# Patient Record
Sex: Male | Born: 1990 | Race: White | Hispanic: No | Marital: Single | Smoking: Never smoker
Health system: Southern US, Community
[De-identification: ages and names within clinical notes are randomized; demographics above are authoritative.]

## PROBLEM LIST (undated history)

## (undated) DIAGNOSIS — S060XAA Concussion with loss of consciousness status unknown, initial encounter: Secondary | ICD-10-CM

## (undated) DIAGNOSIS — F32A Depression, unspecified: Secondary | ICD-10-CM

## (undated) DIAGNOSIS — S82899A Other fracture of unspecified lower leg, initial encounter for closed fracture: Secondary | ICD-10-CM

## (undated) DIAGNOSIS — F329 Major depressive disorder, single episode, unspecified: Secondary | ICD-10-CM

## (undated) DIAGNOSIS — S060X9A Concussion with loss of consciousness of unspecified duration, initial encounter: Secondary | ICD-10-CM

## (undated) DIAGNOSIS — S2239XA Fracture of one rib, unspecified side, initial encounter for closed fracture: Secondary | ICD-10-CM

## (undated) DIAGNOSIS — T1491XA Suicide attempt, initial encounter: Secondary | ICD-10-CM

## (undated) HISTORY — PX: TONSILLECTOMY: SUR1361

---

## 2010-01-05 ENCOUNTER — Other Ambulatory Visit: Payer: Self-pay | Admitting: Emergency Medicine

## 2010-01-05 ENCOUNTER — Inpatient Hospital Stay (HOSPITAL_COMMUNITY): Admission: RE | Admit: 2010-01-05 | Discharge: 2010-01-09 | Payer: Self-pay | Admitting: Psychiatry

## 2010-01-05 ENCOUNTER — Ambulatory Visit: Payer: Self-pay | Admitting: Psychiatry

## 2010-01-15 ENCOUNTER — Ambulatory Visit: Payer: Self-pay | Admitting: Psychiatry

## 2010-01-20 ENCOUNTER — Ambulatory Visit: Payer: Self-pay | Admitting: Psychiatry

## 2010-01-30 ENCOUNTER — Ambulatory Visit: Payer: Self-pay | Admitting: Psychiatry

## 2010-02-06 ENCOUNTER — Ambulatory Visit: Payer: Self-pay | Admitting: Psychiatry

## 2010-03-04 ENCOUNTER — Ambulatory Visit: Payer: Self-pay | Admitting: Psychiatry

## 2010-03-31 ENCOUNTER — Ambulatory Visit: Payer: Self-pay | Admitting: Psychiatry

## 2010-05-05 ENCOUNTER — Ambulatory Visit: Payer: Self-pay | Admitting: Psychiatry

## 2011-01-12 LAB — URINALYSIS, ROUTINE W REFLEX MICROSCOPIC
Hgb urine dipstick: NEGATIVE
Nitrite: NEGATIVE
Protein, ur: NEGATIVE mg/dL
Specific Gravity, Urine: 1.031 — ABNORMAL HIGH (ref 1.005–1.030)
Urobilinogen, UA: 1 mg/dL (ref 0.0–1.0)

## 2011-01-12 LAB — COMPREHENSIVE METABOLIC PANEL
ALT: 30 U/L (ref 0–53)
AST: 28 U/L (ref 0–37)
CO2: 24 mEq/L (ref 19–32)
Chloride: 107 mEq/L (ref 96–112)
Creatinine, Ser: 0.77 mg/dL (ref 0.4–1.5)
GFR calc Af Amer: >60 mL/min (ref >60)
GFR calc non Af Amer: >60 mL/min (ref >60)
Glucose, Bld: 120 mg/dL — ABNORMAL HIGH (ref 70–99)
Sodium: 139 mEq/L (ref 135–145)
Total Bilirubin: 0.7 mg/dL (ref 0.3–1.2)

## 2011-01-12 LAB — CBC
Hemoglobin: 15 g/dL (ref 13.0–17.0)
MCV: 88.5 fL (ref 78.0–100.0)
RBC: 5.06 MIL/uL (ref 4.22–5.81)
WBC: 9.8 10*3/uL (ref 4.0–10.5)

## 2011-01-12 LAB — DIFFERENTIAL
Basophils Absolute: 0 10*3/uL (ref 0.0–0.1)
Basophils Relative: 0 % (ref 0–1)
Eosinophils Absolute: 0.2 10*3/uL (ref 0.0–0.7)
Eosinophils Relative: 2 % (ref 0–5)
Neutrophils Relative %: 56 % (ref 43–77)

## 2011-01-12 LAB — RAPID URINE DRUG SCREEN, HOSP PERFORMED
Amphetamines: NOT DETECTED
Barbiturates: NOT DETECTED
Opiates: NOT DETECTED

## 2011-01-12 LAB — SALICYLATE LEVEL: Salicylate Lvl: <4 mg/dL (ref 2.8–20.0)

## 2013-02-12 ENCOUNTER — Emergency Department (HOSPITAL_COMMUNITY): Payer: Federal, State, Local not specified - PPO

## 2013-02-12 ENCOUNTER — Inpatient Hospital Stay (HOSPITAL_COMMUNITY)
Admission: EM | Admit: 2013-02-12 | Discharge: 2013-02-15 | DRG: 533 | Disposition: A | Discharge status: Home / Self Care | Payer: Federal, State, Local not specified - PPO | Attending: General Surgery | Admitting: General Surgery

## 2013-02-12 DIAGNOSIS — S27322A Contusion of lung, bilateral, initial encounter: Secondary | ICD-10-CM

## 2013-02-12 DIAGNOSIS — S060X9A Concussion with loss of consciousness of unspecified duration, initial encounter: Secondary | ICD-10-CM

## 2013-02-12 DIAGNOSIS — Z6833 Body mass index (BMI) 33.0-33.9, adult: Secondary | ICD-10-CM

## 2013-02-12 DIAGNOSIS — M79609 Pain in unspecified limb: Secondary | ICD-10-CM | POA: Diagnosis present

## 2013-02-12 DIAGNOSIS — IMO0002 Reserved for concepts with insufficient information to code with codable children: Secondary | ICD-10-CM | POA: Diagnosis present

## 2013-02-12 DIAGNOSIS — Y92410 Unspecified street and highway as the place of occurrence of the external cause: Secondary | ICD-10-CM

## 2013-02-12 DIAGNOSIS — S92102A Unspecified fracture of left talus, initial encounter for closed fracture: Secondary | ICD-10-CM

## 2013-02-12 DIAGNOSIS — S27329A Contusion of lung, unspecified, initial encounter: Secondary | ICD-10-CM | POA: Diagnosis present

## 2013-02-12 DIAGNOSIS — S2242XA Multiple fractures of ribs, left side, initial encounter for closed fracture: Secondary | ICD-10-CM

## 2013-02-12 DIAGNOSIS — S82899A Other fracture of unspecified lower leg, initial encounter for closed fracture: Secondary | ICD-10-CM

## 2013-02-12 DIAGNOSIS — S92109A Unspecified fracture of unspecified talus, initial encounter for closed fracture: Secondary | ICD-10-CM | POA: Diagnosis present

## 2013-02-12 DIAGNOSIS — T07XXXA Unspecified multiple injuries, initial encounter: Secondary | ICD-10-CM | POA: Diagnosis present

## 2013-02-12 DIAGNOSIS — S2249XA Multiple fractures of ribs, unspecified side, initial encounter for closed fracture: Secondary | ICD-10-CM | POA: Diagnosis present

## 2013-02-12 DIAGNOSIS — S61409A Unspecified open wound of unspecified hand, initial encounter: Secondary | ICD-10-CM

## 2013-02-12 DIAGNOSIS — S060X0A Concussion without loss of consciousness, initial encounter: Principal | ICD-10-CM | POA: Diagnosis present

## 2013-02-12 DIAGNOSIS — S335XXA Sprain of ligaments of lumbar spine, initial encounter: Secondary | ICD-10-CM | POA: Diagnosis present

## 2013-02-12 DIAGNOSIS — M542 Cervicalgia: Secondary | ICD-10-CM | POA: Diagnosis present

## 2013-02-12 DIAGNOSIS — J9600 Acute respiratory failure, unspecified whether with hypoxia or hypercapnia: Secondary | ICD-10-CM | POA: Diagnosis present

## 2013-02-12 DIAGNOSIS — S060XAA Concussion with loss of consciousness status unknown, initial encounter: Secondary | ICD-10-CM

## 2013-02-12 DIAGNOSIS — J95821 Acute postprocedural respiratory failure: Secondary | ICD-10-CM

## 2013-02-12 DIAGNOSIS — S4350XA Sprain of unspecified acromioclavicular joint, initial encounter: Secondary | ICD-10-CM | POA: Diagnosis present

## 2013-02-12 DIAGNOSIS — E663 Overweight: Secondary | ICD-10-CM | POA: Diagnosis present

## 2013-02-12 DIAGNOSIS — M545 Low back pain, unspecified: Secondary | ICD-10-CM | POA: Diagnosis present

## 2013-02-12 DIAGNOSIS — S39012A Strain of muscle, fascia and tendon of lower back, initial encounter: Secondary | ICD-10-CM

## 2013-02-12 DIAGNOSIS — R4182 Altered mental status, unspecified: Secondary | ICD-10-CM | POA: Diagnosis present

## 2013-02-12 DIAGNOSIS — M25569 Pain in unspecified knee: Secondary | ICD-10-CM | POA: Diagnosis present

## 2013-02-12 HISTORY — DX: Depression, unspecified: F32.A

## 2013-02-12 HISTORY — DX: Other fracture of unspecified lower leg, initial encounter for closed fracture: S82.899A

## 2013-02-12 HISTORY — DX: Major depressive disorder, single episode, unspecified: F32.9

## 2013-02-12 LAB — POCT I-STAT, CHEM 8
Calcium, Ion: 1.1 mmol/L — ABNORMAL LOW (ref 1.12–1.23)
Creatinine, Ser: 1.2 mg/dL (ref 0.50–1.35)
Glucose, Bld: 132 mg/dL — ABNORMAL HIGH (ref 70–99)
HCT: 48 % (ref 39.0–52.0)
Hemoglobin: 16.3 g/dL (ref 13.0–17.0)
TCO2: 23 mmol/L (ref 0–100)

## 2013-02-12 LAB — COMPREHENSIVE METABOLIC PANEL
AST: 34 U/L (ref 0–37)
Albumin: 3.7 g/dL (ref 3.5–5.2)
BUN: 12 mg/dL (ref 6–23)
Calcium: 9.2 mg/dL (ref 8.4–10.5)
Creatinine, Ser: 0.87 mg/dL (ref 0.50–1.35)
Total Protein: 7 g/dL (ref 6.0–8.3)

## 2013-02-12 LAB — CBC WITH DIFFERENTIAL/PLATELET
Eosinophils Relative: 4 % (ref 0–5)
Hemoglobin: 16 g/dL (ref 13.0–17.0)
Lymphocytes Relative: 35 % (ref 12–46)
Lymphs Abs: 5.2 10*3/uL — ABNORMAL HIGH (ref 0.7–4.0)
MCV: 82.5 fL (ref 78.0–100.0)
Neutrophils Relative %: 56 % (ref 43–77)
Platelets: 307 10*3/uL (ref 150–400)
RBC: 5.44 MIL/uL (ref 4.22–5.81)
WBC: 14.9 10*3/uL — ABNORMAL HIGH (ref 4.0–10.5)

## 2013-02-12 LAB — POCT I-STAT 3, ART BLOOD GAS (G3+)
Acid-base deficit: 7 mmol/L — ABNORMAL HIGH (ref 0.0–2.0)
O2 Saturation: 100 %
Patient temperature: 98.6

## 2013-02-12 LAB — URINALYSIS, MICROSCOPIC ONLY
Glucose, UA: NEGATIVE mg/dL
Leukocytes, UA: NEGATIVE
Protein, ur: 100 mg/dL — AB
Specific Gravity, Urine: 1.015 (ref 1.005–1.030)
pH: 5 (ref 5.0–8.0)

## 2013-02-12 LAB — URINE MICROSCOPIC-ADD ON

## 2013-02-12 LAB — CBC
HCT: 42.7 % (ref 39.0–52.0)
MCH: 28.9 pg (ref 26.0–34.0)
MCV: 82.3 fL (ref 78.0–100.0)
Platelets: 238 10*3/uL (ref 150–400)
RBC: 5.19 MIL/uL (ref 4.22–5.81)
RDW: 12.9 % (ref 11.5–15.5)

## 2013-02-12 LAB — URINALYSIS, ROUTINE W REFLEX MICROSCOPIC
Leukocytes, UA: NEGATIVE
Nitrite: NEGATIVE
Specific Gravity, Urine: 1.041 — ABNORMAL HIGH (ref 1.005–1.030)
Urobilinogen, UA: 0.2 mg/dL (ref 0.0–1.0)

## 2013-02-12 LAB — RAPID URINE DRUG SCREEN, HOSP PERFORMED: Barbiturates: NOT DETECTED

## 2013-02-12 LAB — GLUCOSE, CAPILLARY
Glucose-Capillary: 123 mg/dL — ABNORMAL HIGH (ref 70–99)
Glucose-Capillary: 155 mg/dL — ABNORMAL HIGH (ref 70–99)

## 2013-02-12 LAB — PROTIME-INR: INR: 1.05 (ref 0.00–1.49)

## 2013-02-12 LAB — BASIC METABOLIC PANEL
BUN: 10 mg/dL (ref 6–23)
CO2: 19 mEq/L (ref 19–32)
Calcium: 8.7 mg/dL (ref 8.4–10.5)
Creatinine, Ser: 0.66 mg/dL (ref 0.50–1.35)

## 2013-02-12 MED ORDER — MIDAZOLAM HCL 2 MG/2ML IJ SOLN
INTRAMUSCULAR | Status: AC
  Administered 2013-02-12: 2 mg
  Filled 2013-02-12: qty 4

## 2013-02-12 MED ORDER — SODIUM CHLORIDE 0.9 % IV SOLN
1.0000 mg/h | INTRAVENOUS | Status: DC
  Filled 2013-02-12: qty 10

## 2013-02-12 MED ORDER — SUCCINYLCHOLINE CHLORIDE 20 MG/ML IJ SOLN
INTRAMUSCULAR | Status: AC
  Administered 2013-02-12: 20 mg
  Filled 2013-02-12: qty 1

## 2013-02-12 MED ORDER — TETANUS-DIPHTHERIA TOXOIDS TD 5-2 LFU IM INJ
0.5000 mL | INJECTION | Freq: Once | INTRAMUSCULAR | Status: AC
  Administered 2013-02-12: 0.5 mL via INTRAMUSCULAR
  Filled 2013-02-12: qty 0.5

## 2013-02-12 MED ORDER — MIDAZOLAM HCL 2 MG/2ML IJ SOLN
2.0000 mg | INTRAMUSCULAR | Status: DC | PRN
  Administered 2013-02-12 (×3): 2 mg via INTRAVENOUS

## 2013-02-12 MED ORDER — ONDANSETRON HCL 4 MG PO TABS: 4.0000 mg | ORAL_TABLET | Freq: Four times a day (QID) | ORAL | Status: DC | PRN

## 2013-02-12 MED ORDER — IOHEXOL 300 MG/ML  SOLN
100.0000 mL | Freq: Once | INTRAMUSCULAR | Status: AC | PRN
  Administered 2013-02-12: 100 mL via INTRAVENOUS

## 2013-02-12 MED ORDER — MIDAZOLAM HCL 2 MG/2ML IJ SOLN: 2.0000 mg | Freq: Once | INTRAMUSCULAR | Status: DC

## 2013-02-12 MED ORDER — ROCURONIUM BROMIDE 50 MG/5ML IV SOLN
INTRAVENOUS | Status: AC
  Administered 2013-02-12: 10 mg
  Filled 2013-02-12: qty 2

## 2013-02-12 MED ORDER — OXYCODONE HCL 5 MG PO TABS
5.0000 mg | ORAL_TABLET | ORAL | Status: DC | PRN
  Administered 2013-02-12: 10 mg via ORAL
  Administered 2013-02-12: 5 mg via ORAL
  Administered 2013-02-12 – 2013-02-15 (×11): 10 mg via ORAL
  Filled 2013-02-12 (×11): qty 2
  Filled 2013-02-12: qty 1
  Filled 2013-02-12: qty 2

## 2013-02-12 MED ORDER — VECURONIUM BROMIDE 10 MG IV SOLR: 10.0000 mg | Freq: Once | INTRAVENOUS | Status: DC

## 2013-02-12 MED ORDER — FENTANYL CITRATE 0.05 MG/ML IJ SOLN
INTRAMUSCULAR | Status: AC
  Filled 2013-02-12: qty 2

## 2013-02-12 MED ORDER — MIDAZOLAM HCL 2 MG/2ML IJ SOLN
4.0000 mg | Freq: Once | INTRAMUSCULAR | Status: AC
  Administered 2013-02-12: 4 mg via INTRAVENOUS

## 2013-02-12 MED ORDER — MIDAZOLAM HCL 2 MG/2ML IJ SOLN
2.0000 mg | Freq: Once | INTRAMUSCULAR | Status: DC
  Filled 2013-02-12: qty 2

## 2013-02-12 MED ORDER — LIDOCAINE HCL (CARDIAC) 20 MG/ML IV SOLN
INTRAVENOUS | Status: AC
  Filled 2013-02-12: qty 5

## 2013-02-12 MED ORDER — BACITRACIN-NEOMYCIN-POLYMYXIN OINTMENT TUBE
TOPICAL_OINTMENT | Freq: Every day | CUTANEOUS | Status: DC
  Administered 2013-02-12 – 2013-02-15 (×5): via TOPICAL
  Filled 2013-02-12 (×2): qty 15

## 2013-02-12 MED ORDER — MIDAZOLAM HCL 2 MG/2ML IJ SOLN
INTRAMUSCULAR | Status: AC
  Administered 2013-02-12: 2 mg
  Filled 2013-02-12: qty 2

## 2013-02-12 MED ORDER — ETOMIDATE 2 MG/ML IV SOLN
INTRAVENOUS | Status: AC
  Administered 2013-02-12: 20 mg
  Filled 2013-02-12: qty 20

## 2013-02-12 MED ORDER — SODIUM CHLORIDE 0.9 % IV SOLN
10.0000 ug/h | INTRAVENOUS | Status: DC
  Administered 2013-02-12: 100 ug/h via INTRAVENOUS
  Filled 2013-02-12: qty 50

## 2013-02-12 MED ORDER — FENTANYL CITRATE 0.05 MG/ML IJ SOLN
250.0000 ug/h | INTRAMUSCULAR | Status: DC
  Filled 2013-02-12: qty 50

## 2013-02-12 MED ORDER — MIDAZOLAM HCL 2 MG/2ML IJ SOLN: 1.0000 mg | INTRAMUSCULAR | Status: DC | PRN

## 2013-02-12 MED ORDER — ONDANSETRON HCL 4 MG/2ML IJ SOLN
4.0000 mg | INTRAMUSCULAR | Status: DC | PRN
  Administered 2013-02-12: 4 mg via INTRAVENOUS
  Filled 2013-02-12: qty 2

## 2013-02-12 MED ORDER — BACITRACIN ZINC 500 UNIT/GM EX OINT
TOPICAL_OINTMENT | Freq: Two times a day (BID) | CUTANEOUS | Status: DC
  Administered 2013-02-12: 1 via TOPICAL
  Administered 2013-02-12 – 2013-02-13 (×2): via TOPICAL
  Administered 2013-02-14: 1 via TOPICAL
  Administered 2013-02-14 – 2013-02-15 (×2): via TOPICAL
  Filled 2013-02-12 (×2): qty 15

## 2013-02-12 MED ORDER — KCL IN DEXTROSE-NACL 20-5-0.9 MEQ/L-%-% IV SOLN
INTRAVENOUS | Status: DC
  Administered 2013-02-12 – 2013-02-13 (×4): via INTRAVENOUS
  Filled 2013-02-12 (×7): qty 1000

## 2013-02-12 MED ORDER — BIOTENE DRY MOUTH MT LIQD
15.0000 mL | Freq: Four times a day (QID) | OROMUCOSAL | Status: DC
  Administered 2013-02-12 – 2013-02-13 (×4): 15 mL via OROMUCOSAL

## 2013-02-12 MED ORDER — MIDAZOLAM HCL 2 MG/2ML IJ SOLN
INTRAMUSCULAR | Status: AC
  Administered 2013-02-12: 2 mg
  Filled 2013-02-12: qty 6

## 2013-02-12 MED ORDER — CHLORHEXIDINE GLUCONATE 0.12 % MT SOLN
15.0000 mL | Freq: Two times a day (BID) | OROMUCOSAL | Status: DC
  Administered 2013-02-12 (×2): 15 mL via OROMUCOSAL
  Filled 2013-02-12 (×2): qty 15

## 2013-02-12 MED ORDER — METOPROLOL TARTRATE 1 MG/ML IV SOLN: 5.0000 mg | INTRAVENOUS | Status: DC | PRN

## 2013-02-12 MED ORDER — MIDAZOLAM HCL 2 MG/2ML IJ SOLN
INTRAMUSCULAR | Status: AC
  Filled 2013-02-12: qty 4

## 2013-02-12 MED ORDER — FENTANYL CITRATE 0.05 MG/ML IJ SOLN
100.0000 ug | Freq: Once | INTRAMUSCULAR | Status: AC
  Administered 2013-02-12: 100 ug via INTRAVENOUS
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
## 2013-02-12 NOTE — ED Notes (Signed)
succ 120mg  given iv per kim rn

## 2013-02-12 NOTE — Progress Notes (Signed)
Patient ID: Daniel Hodge, male   DOB: 10-18-1991, 22 y.o.   MRN: [id number] Patient extubated.  He denies SI and denies this was an attempt to harm himself. Patient examined and I agree with the assessment and plan  Violeta Gelinas, MD, MPH, FACS Pager: [phone]  02/12/2013 11:29 AM

## 2013-02-12 NOTE — ED Notes (Signed)
To c-t can 0115am bp 131/54.  Fentanyl iv 0055a.  Versed 6 mg  Iv per kim rn dr Purnell Shoemaker here to see 0100am 011`4 am.  Foley inserted urine clear og insertedgreenishliquid draining.  0115 bp 122/48 p 92   0114 fentanyl drip started 50mg  the 100mg  iv.  Returned from c-t 0150  Hand xray performed then back to ed.  bp 121/58 p95 02 sat 100%

## 2013-02-12 NOTE — ED Notes (Signed)
Family here in waiting

## 2013-02-12 NOTE — ED Notes (Signed)
Urine from foley cath sent for a urne drug screen routine ua and culture

## 2013-02-12 NOTE — ED Provider Notes (Addendum)
Medical screening examination/treatment/procedure(s) were conducted as a shared visit with non-physician practitioner(s) and myself.  I personally evaluated the patient during the encounter  I personally saw and examined the patient and the resident only did the intubation and I was present for the entirety of same  Pharrell Ledford K Iantha Titsworth-Rasch, MD 02/12/13 0659  Rayvn Rickerson K Dasani Crear-Rasch, MD 02/24/13 [phone]
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
## 2013-02-12 NOTE — ED Notes (Signed)
Pt continuing to speak without complications.  Dr Nicanor Alcon edp  Treating the pt

## 2013-02-12 NOTE — Procedures (Signed)
Extubation Procedure Note  Patient Details:   Name: [place] Souter DOB: 12/21/1990 MRN: [id number]   Airway Documentation:   Patient weaned successfully from vent support. VC 1200cc's and good cuff leak. Now wearing Warden with O2 running at 4lpm.  Evaluation  O2 sats: stable throughout Complications: No apparent complications Patient did tolerate procedure well. Bilateral Breath Sounds: Clear Suctioning: Oral;Airway Yes  Clearance Coots 02/12/2013, 11:29 AM

## 2013-02-12 NOTE — ED Notes (Signed)
Port  Chest done

## 2013-02-12 NOTE — Progress Notes (Signed)
Vent changes noted made per MD orders.

## 2013-02-12 NOTE — ED Notes (Signed)
3rd liter  Of nss added to iv [employer]

## 2013-02-12 NOTE — ED Notes (Signed)
The pts versed drip has not been started per dr Purnell Shoemaker.  Fentanyl drip continues

## 2013-02-12 NOTE — ED Notes (Signed)
Rt hand laceration repaired by dr Purnell Shoemaker

## 2013-02-12 NOTE — ED Notes (Signed)
lsb removed c-collar remains in place
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
## 2013-02-12 NOTE — ED Notes (Signed)
The pts pulses  Are strong both hand color is nromal  Loose fitting .
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
## 2013-02-12 NOTE — ED Notes (Signed)
tdap given im rt deltoir
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
## 2013-02-12 NOTE — ED Notes (Signed)
Report called to 2100.  

## 2013-02-12 NOTE — ED Notes (Signed)
The pt has been given versed 2 mg then 4mg  iv per dr Lear Ng orders that was in the room with him.  Soft wrists restraints have been applied to his wrists.  He occasionally reaches up to the vent tubing.
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
## 2013-02-12 NOTE — ED Notes (Signed)
Pt given meds for intubation. Lidocaine 100mg  iv now kim rn

## 2013-02-12 NOTE — ED Notes (Signed)
Pt intubeated an placed on vent by resp therapy
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
## 2013-02-12 NOTE — ED Notes (Signed)
amidate 20 mg iv per kim rn

## 2013-02-13 ENCOUNTER — Inpatient Hospital Stay (HOSPITAL_COMMUNITY): Payer: Federal, State, Local not specified - PPO

## 2013-02-13 ENCOUNTER — Encounter (HOSPITAL_COMMUNITY): Payer: Self-pay

## 2013-02-13 LAB — CBC
HCT: 37.6 % — ABNORMAL LOW (ref 39.0–52.0)
Hemoglobin: 13 g/dL (ref 13.0–17.0)
MCH: 29 pg (ref 26.0–34.0)
MCHC: 34.6 g/dL (ref 30.0–36.0)
RDW: 13.2 % (ref 11.5–15.5)

## 2013-02-13 LAB — BASIC METABOLIC PANEL
BUN: 6 mg/dL (ref 6–23)
Calcium: 9 mg/dL (ref 8.4–10.5)
Creatinine, Ser: 0.66 mg/dL (ref 0.50–1.35)
GFR calc Af Amer: >90 mL/min (ref >90)
GFR calc non Af Amer: >90 mL/min (ref >90)
Glucose, Bld: 125 mg/dL — ABNORMAL HIGH (ref 70–99)
Potassium: 3.8 mEq/L (ref 3.5–5.1)

## 2013-02-13 LAB — URINE CULTURE

## 2013-02-13 MED ORDER — DULOXETINE HCL 60 MG PO CPEP
60.0000 mg | ORAL_CAPSULE | Freq: Every day | ORAL | Status: DC
  Administered 2013-02-13 – 2013-02-15 (×3): 60 mg via ORAL
  Filled 2013-02-13 (×3): qty 1

## 2013-02-13 NOTE — Progress Notes (Signed)
Pt transferred to 6N via ICU bed.  VSS . Report to RN. Family at bedside
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
## 2013-02-14 ENCOUNTER — Encounter (HOSPITAL_COMMUNITY): Payer: Self-pay | Admitting: General Practice

## 2013-02-14 ENCOUNTER — Inpatient Hospital Stay (HOSPITAL_COMMUNITY): Payer: Federal, State, Local not specified - PPO

## 2013-02-14 DIAGNOSIS — J9600 Acute respiratory failure, unspecified whether with hypoxia or hypercapnia: Secondary | ICD-10-CM | POA: Diagnosis present

## 2013-02-14 DIAGNOSIS — T07XXXA Unspecified multiple injuries, initial encounter: Secondary | ICD-10-CM | POA: Diagnosis present

## 2013-02-14 DIAGNOSIS — S92102A Unspecified fracture of left talus, initial encounter for closed fracture: Secondary | ICD-10-CM

## 2013-02-14 DIAGNOSIS — S39012A Strain of muscle, fascia and tendon of lower back, initial encounter: Secondary | ICD-10-CM

## 2013-02-14 DIAGNOSIS — S2242XA Multiple fractures of ribs, left side, initial encounter for closed fracture: Secondary | ICD-10-CM

## 2013-02-14 DIAGNOSIS — S27322A Contusion of lung, bilateral, initial encounter: Secondary | ICD-10-CM

## 2013-02-14 MED ORDER — DOCUSATE SODIUM 100 MG PO CAPS
200.0000 mg | ORAL_CAPSULE | Freq: Two times a day (BID) | ORAL | Status: DC
  Administered 2013-02-14 – 2013-02-15 (×3): 200 mg via ORAL
  Filled 2013-02-14 (×3): qty 2

## 2013-02-14 MED ORDER — POLYETHYLENE GLYCOL 3350 17 G PO PACK
17.0000 g | PACK | Freq: Every day | ORAL | Status: DC
  Administered 2013-02-14 – 2013-02-15 (×2): 17 g via ORAL
  Filled 2013-02-14 (×3): qty 1

## 2013-02-14 MED ORDER — MAGNESIUM CITRATE PO SOLN
1.0000 | Freq: Every day | ORAL | Status: DC | PRN
  Filled 2013-02-14: qty 296
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
## 2013-02-15 DIAGNOSIS — S060X9A Concussion with loss of consciousness of unspecified duration, initial encounter: Secondary | ICD-10-CM

## 2013-02-15 MED ORDER — DSS 100 MG PO CAPS: 200.0000 mg | ORAL_CAPSULE | Freq: Two times a day (BID) | ORAL | Status: DC

## 2013-02-15 MED ORDER — OXYCODONE-ACETAMINOPHEN 5-325 MG PO TABS: 1.0000 | ORAL_TABLET | ORAL | Status: DC | PRN

## 2013-02-15 MED ORDER — POLYETHYLENE GLYCOL 3350 17 G PO PACK: 17.0000 g | PACK | Freq: Every day | ORAL | Status: DC

## 2013-02-15 NOTE — Discharge Summary (Addendum)
Will be able to go home today after seen by therapy services.  This patient has been seen and I agree with the findings and treatment plan.  Marta Lamas. Gae Bon, MD, FACS [phone] (pager) [phone] (direct pager) Trauma Surgeon

## 2013-02-15 NOTE — Progress Notes (Signed)
Occupational Therapy Treatment Patient Details Name: Daniel Hodge MRN: [id number] DOB: 04-09-1991 Today's Date: 02/15/2013 Time: 1308-6578 OT Time Calculation (min): 19 min  OT Assessment / Plan / Recommendation Comments on Treatment Session Pt stated that he is feeling better, but did not feel like OOB activity with OT this afternoon, but agreeable to ADL A/E and DME education. Pt scheduled to d/c home this afternoon with family sup/assist    Follow Up Recommendations  No OT follow up    Barriers to Discharge   None    Equipment Recommendations  Tub/shower bench    Recommendations for Other Services    Frequency     Plan Discharge plan remains appropriate    Precautions / Restrictions Precautions Precautions: None Restrictions Weight Bearing Restrictions: Yes LLE Weight Bearing: Weight bearing as tolerated   Pertinent Vitals/Pain     ADL  ADL Comments: pt and family educated on ADL A/E and tub bench for home use, pt declined return demo of tub bench transfer and stated that he did not need ADL A/E. Pt's family concerned about delivery of thub bench due to the fact that they live in [place]. OT called case mgr  x 2 and left messages to speak with pt/family about tub bench    OT Diagnosis:    OT Problem List:   OT Treatment Interventions:     OT Goals    Visit Information  Last OT Received On: 02/15/13 Assistance Needed: +1    Subjective Data  Subjective: " I am doing better now and going home today " Patient Stated Goal: To return home   Prior Functioning       Cognition  Cognition Arousal/Alertness: Awake/alert Behavior During Therapy: WFL for tasks assessed/performed Overall Cognitive Status: Within Functional Limits for tasks assessed    Mobility  Bed Mobility Bed Mobility: Not assessed Transfers Transfers: Not assessed Sit to Stand: 6: Modified independent (Device/Increase time);From bed Stand to Sit: 6: Modified independent (Device/Increase  time);To chair/3-in-1;With armrests Details for Transfer Assistance: demo good technique; c/o R calf "stiffness"     Exercises      Balance Balance Balance Assessed: No   End of Session  In bed with call bell in reach and family present  GO     Galen Manila 02/15/2013, 3:25 PM

## 2013-02-15 NOTE — Consult Note (Signed)
I have seen and examined the patient. I agree with the findings above.  L talus fracture vs lig avulsion for CT R calf pain L ACJ sprain  Plan as noted  Rachyl Wuebker H, MD 02/15/2013 9:02 AM
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
## 2013-02-15 NOTE — Progress Notes (Signed)
L talus only ligament avulsion R tib-fib films negative for fracture  PE: no change in exam with excellent ROM bilat  Plan: 1. WBAT bilat, wean from walker as tolerated and cont ROM 2. F/u in 2 weeks for repeat assessment at office  Myrene Galas, MD [facility] [phone] (p)

## 2013-02-15 NOTE — Progress Notes (Signed)
Orthopedic Tech Progress Note Patient Details:  Daniel Hodge 01/02/1991 [id number] Applied air cast to LLE Ortho Devices Type of Ortho Device: Ankle Air splint Ortho Device/Splint Location: LLE Ortho Device/Splint Interventions: Application   [name] 02/15/2013, 10:55 AM

## 2013-02-15 NOTE — Progress Notes (Signed)
This patient has been seen and I agree with the findings and treatment plan.  Sherline Eberwein O. Enrique Weiss, III, MD, FACS [phone] (pager) [phone] (direct pager) Trauma Surgeon  
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
## 2013-02-15 NOTE — Progress Notes (Signed)
Physical Therapy Treatment Patient Details Name: Daniel Hodge MRN: [id number] DOB: September 26, 1991 Today's Date: 02/15/2013 Time: 5784-6962 PT Time Calculation (min): 12 min  PT Assessment / Plan / Recommendation Comments on Treatment Session  Pt WB status change made pt more independent with gt and mobility today. Pt demo good technique and safety awaress with RW to increase support. Pt safe from PT standpoint to D/C home with family at this time.     Follow Up Recommendations  Outpatient PT;Other (comment) (once air cast is removed )     Does the patient have the potential to tolerate intense rehabilitation     Barriers to Discharge        Equipment Recommendations  Rolling walker with 5" wheels;Other (comment) (shower bench)    Recommendations for Other Services    Frequency Min 5X/week   Plan Discharge plan remains appropriate;Frequency remains appropriate    Precautions / Restrictions Precautions Precautions: None Restrictions Weight Bearing Restrictions: Yes LLE Weight Bearing: Weight bearing as tolerated   Pertinent Vitals/Pain 4/10 in R calf; describes it as "stiffness'.     Mobility  Bed Mobility Bed Mobility: Not assessed Transfers Transfers: Sit to Stand;Stand to Sit Sit to Stand: 6: Modified independent (Device/Increase time);From bed Stand to Sit: 6: Modified independent (Device/Increase time);To chair/3-in-1;With armrests Details for Transfer Assistance: demo good technique; c/o R calf "stiffness"  Ambulation/Gait Ambulation/Gait Assistance: 6: Modified independent (Device/Increase time) Ambulation Distance (Feet): 200 Feet Assistive device: Rolling walker Ambulation/Gait Assistance Details: pt demo good technique with RW; increased steadiness and indpendence today secondary to WB status change on L LE; pt stated he would like to cont on with RW till pain subsides  Gait Pattern: Step-through pattern Gait velocity: decreased secondary to pain  Stairs:  Yes Stairs Assistance: 4: Min guard Stair Management Technique: No rails;Backwards;With walker Number of Stairs: 2 Wheelchair Mobility Wheelchair Mobility: No    Exercises     PT Diagnosis:    PT Problem List:   PT Treatment Interventions:     PT Goals Acute Rehab PT Goals PT Goal Formulation: With patient Time For Goal Achievement: 02/21/13 Potential to Achieve Goals: Good PT Transfer Goal: Bed to Chair/Chair to Bed - Progress: Met PT Goal: Ambulate - Progress: Met  Visit Information  Last PT Received On: 02/15/13 Assistance Needed: +1    Subjective Data  Subjective: sitting EOB; agreeable to therapy. " I am ready to get the he@@ out of here today"  Patient Stated Goal: home today    Cognition  Cognition Arousal/Alertness: Awake/alert Behavior During Therapy: WFL for tasks assessed/performed Overall Cognitive Status: Within Functional Limits for tasks assessed    Balance  Balance Balance Assessed: No  End of Session PT - End of Session Equipment Utilized During Treatment: Gait belt;Other (comment) (air cast on L LE ) Activity Tolerance: Patient tolerated treatment well Patient left: in chair;with call bell/phone within reach Nurse Communication: Other (comment);Mobility status (DME needs upon D/C )   GP     Donell Sievert,  [phone] 02/15/2013, 2:28 PM

## 2013-02-16 NOTE — Progress Notes (Signed)
Interacted w/staff and [employer] re: patient's contacts and need for [employer] to get ETOH blood test. Marjory Lies Chaplain
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
## 2014-04-23 ENCOUNTER — Observation Stay (HOSPITAL_COMMUNITY)
Admission: EM | Admit: 2014-04-23 | Discharge: 2014-04-25 | Disposition: A | Discharge status: Other Acute Inpatient Hospital | Payer: Federal, State, Local not specified - PPO | Attending: Internal Medicine | Admitting: Internal Medicine

## 2014-04-23 ENCOUNTER — Encounter (HOSPITAL_COMMUNITY): Payer: Self-pay | Admitting: Emergency Medicine

## 2014-04-23 DIAGNOSIS — T50902A Poisoning by unspecified drugs, medicaments and biological substances, intentional self-harm, initial encounter: Secondary | ICD-10-CM

## 2014-04-23 DIAGNOSIS — T6592XA Toxic effect of unspecified substance, intentional self-harm, initial encounter: Secondary | ICD-10-CM

## 2014-04-23 DIAGNOSIS — T43502A Poisoning by unspecified antipsychotics and neuroleptics, intentional self-harm, initial encounter: Secondary | ICD-10-CM | POA: Insufficient documentation

## 2014-04-23 DIAGNOSIS — T50902S Poisoning by unspecified drugs, medicaments and biological substances, intentional self-harm, sequela: Secondary | ICD-10-CM

## 2014-04-23 DIAGNOSIS — F332 Major depressive disorder, recurrent severe without psychotic features: Secondary | ICD-10-CM

## 2014-04-23 DIAGNOSIS — E876 Hypokalemia: Secondary | ICD-10-CM | POA: Insufficient documentation

## 2014-04-23 DIAGNOSIS — S2242XS Multiple fractures of ribs, left side, sequela: Secondary | ICD-10-CM

## 2014-04-23 DIAGNOSIS — T6592XS Toxic effect of unspecified substance, intentional self-harm, sequela: Secondary | ICD-10-CM

## 2014-04-23 DIAGNOSIS — J9601 Acute respiratory failure with hypoxia: Secondary | ICD-10-CM

## 2014-04-23 DIAGNOSIS — T07XXXA Unspecified multiple injuries, initial encounter: Secondary | ICD-10-CM

## 2014-04-23 DIAGNOSIS — T50901A Poisoning by unspecified drugs, medicaments and biological substances, accidental (unintentional), initial encounter: Secondary | ICD-10-CM

## 2014-04-23 DIAGNOSIS — T438X2A Poisoning by other psychotropic drugs, intentional self-harm, initial encounter: Secondary | ICD-10-CM | POA: Insufficient documentation

## 2014-04-23 DIAGNOSIS — T43294A Poisoning by other antidepressants, undetermined, initial encounter: Principal | ICD-10-CM | POA: Insufficient documentation

## 2014-04-23 DIAGNOSIS — I10 Essential (primary) hypertension: Secondary | ICD-10-CM | POA: Insufficient documentation

## 2014-04-23 DIAGNOSIS — S27322S Contusion of lung, bilateral, sequela: Secondary | ICD-10-CM

## 2014-04-23 DIAGNOSIS — T6591XA Toxic effect of unspecified substance, accidental (unintentional), initial encounter: Secondary | ICD-10-CM

## 2014-04-23 DIAGNOSIS — S39012S Strain of muscle, fascia and tendon of lower back, sequela: Secondary | ICD-10-CM

## 2014-04-23 DIAGNOSIS — S92102S Unspecified fracture of left talus, sequela: Secondary | ICD-10-CM

## 2014-04-23 DIAGNOSIS — R1110 Vomiting, unspecified: Secondary | ICD-10-CM | POA: Insufficient documentation

## 2014-04-23 HISTORY — DX: Fracture of one rib, unspecified side, initial encounter for closed fracture: S22.39XA

## 2014-04-23 HISTORY — DX: Concussion with loss of consciousness status unknown, initial encounter: S06.0XAA

## 2014-04-23 HISTORY — DX: Suicide attempt, initial encounter: T14.91XA

## 2014-04-23 HISTORY — DX: Concussion with loss of consciousness of unspecified duration, initial encounter: S06.0X9A

## 2014-04-23 LAB — CBC
HEMATOCRIT: 48.1 % (ref 39.0–52.0)
HEMOGLOBIN: 16.9 g/dL (ref 13.0–17.0)
MCH: 30.2 pg (ref 26.0–34.0)
MCHC: 35.1 g/dL (ref 30.0–36.0)
MCV: 85.9 fL (ref 78.0–100.0)
Platelets: 267 10*3/uL (ref 150–400)
RBC: 5.6 MIL/uL (ref 4.22–5.81)
RDW: 12.4 % (ref 11.5–15.5)
WBC: 15 10*3/uL — ABNORMAL HIGH (ref 4.0–10.5)

## 2014-04-23 LAB — ACETAMINOPHEN LEVEL

## 2014-04-23 LAB — ETHANOL

## 2014-04-23 LAB — COMPREHENSIVE METABOLIC PANEL
ALT: 15 U/L (ref 0–53)
AST: 17 U/L (ref 0–37)
Albumin: 4.8 g/dL (ref 3.5–5.2)
Alkaline Phosphatase: 105 U/L (ref 39–117)
Anion gap: 17 — ABNORMAL HIGH (ref 5–15)
BUN: 17 mg/dL (ref 6–23)
CALCIUM: 10.4 mg/dL (ref 8.4–10.5)
CO2: 23 meq/L (ref 19–32)
CREATININE: 0.66 mg/dL (ref 0.50–1.35)
Chloride: 101 mEq/L (ref 96–112)
GLUCOSE: 102 mg/dL — AB (ref 70–99)
Potassium: 3.6 mEq/L — ABNORMAL LOW (ref 3.7–5.3)
Sodium: 141 mEq/L (ref 137–147)
Total Bilirubin: 0.4 mg/dL (ref 0.3–1.2)
Total Protein: 8.3 g/dL (ref 6.0–8.3)

## 2014-04-23 LAB — SALICYLATE LEVEL: Salicylate Lvl: <2 mg/dL — ABNORMAL LOW (ref 2.8–20.0)

## 2014-04-23 LAB — RAPID URINE DRUG SCREEN, HOSP PERFORMED
Amphetamines: NOT DETECTED
BARBITURATES: NOT DETECTED
BENZODIAZEPINES: NOT DETECTED
Cocaine: NOT DETECTED
Opiates: NOT DETECTED
TETRAHYDROCANNABINOL: NOT DETECTED

## 2014-04-23 MED ORDER — SODIUM CHLORIDE 0.9 % IV SOLN
INTRAVENOUS | Status: AC
  Administered 2014-04-23: 23:00:00 via INTRAVENOUS

## 2014-04-23 MED ORDER — ACTIDOSE WITH SORBITOL 50 GM/240ML PO LIQD
50.0000 g | Freq: Once | ORAL | Status: DC
  Filled 2014-04-23: qty 240

## 2014-04-23 MED ORDER — SODIUM CHLORIDE 0.9 % IJ SOLN
3.0000 mL | Freq: Two times a day (BID) | INTRAMUSCULAR | Status: DC
  Administered 2014-04-24: 3 mL via INTRAVENOUS

## 2014-04-23 NOTE — ED Provider Notes (Signed)
CSN: [id number]     Arrival date & time 04/23/14  2002 History   First MD Initiated Contact with Patient 04/23/14 2013     Chief Complaint  Patient presents with  . Drug Overdose    Cymbalta (170)     (Consider location/radiation/quality/duration/timing/severity/associated sxs/prior Treatment) HPI Comments: Patient presents to the ER for evaluation of drug overdose. Patient reports a suicide attempt at 7:15. Patient took 177 ball 2 tablets, 15 ibuprofen tablets and another medicine that is unidentified at this time. Patient admits to being depressed and wanting to die. This is the second time he has attempted suicide in 5 years. He has started to have some cramping abdominal pain and nausea, no vomiting.  Patient is a 23 y.o. male presenting with Overdose.  Drug Overdose Associated symptoms include abdominal pain.    Past Medical History  Diagnosis Date  . Depression   . Fracture of ankle, closed 02/12/2013  . Suicide attempt   . Rib fracture   . Concussion    Past Surgical History  Procedure Laterality Date  . Tonsillectomy     History reviewed. No pertinent family history. History  Substance Use Topics  . Smoking status: Never Smoker   . Smokeless tobacco: Never Used  . Alcohol Use: Yes     Comment: occassional    Review of Systems  Gastrointestinal: Positive for abdominal pain.  Psychiatric/Behavioral: Positive for suicidal ideas and dysphoric mood.  All other systems reviewed and are negative.     Allergies  Review of patient's allergies indicates no known allergies.  Home Medications   Prior to Admission medications   Medication Sig Start Date End Date Taking? Authorizing Provider  docusate sodium 100 MG CAPS Take 200 mg by mouth 2 (two) times daily. 02/15/13   Freeman CaldronMichael J. Jeffery, PA-C  DULoxetine (CYMBALTA) 60 MG capsule Take 60 mg by mouth daily.    Historical Provider, MD  oxyCODONE-acetaminophen (ROXICET) 5-325 MG per tablet Take 1-2 tablets by mouth  every 4 (four) hours as needed for pain. 02/15/13   Freeman CaldronMichael J. Jeffery, PA-C  polyethylene glycol [facility](MIRALAX / Ethelene HalGLYCOLAX) packet Take 17 g by mouth daily. 02/15/13   Freeman CaldronMichael J. Jeffery, PA-C   BP 162/94  Pulse 80  Temp(Src) 97.8 F (36.6 C) (Oral)  Resp 18  SpO2 95% Physical Exam  Constitutional: He is oriented to person, place, and time. He appears well-developed and well-nourished. No distress.  HENT:  Head: Normocephalic and atraumatic.  Right Ear: Hearing normal.  Left Ear: Hearing normal.  Nose: Nose normal.  Mouth/Throat: Oropharynx is clear and moist and mucous membranes are normal.  Eyes: Conjunctivae and EOM are normal. Pupils are equal, round, and reactive to light.  Neck: Normal range of motion. Neck supple.  Cardiovascular: Regular rhythm, S1 normal and S2 normal.  Exam reveals no gallop and no friction rub.   No murmur heard. Pulmonary/Chest: Effort normal and breath sounds normal. No respiratory distress. He exhibits no tenderness.  Abdominal: Soft. Normal appearance and bowel sounds are normal. There is no hepatosplenomegaly. There is no tenderness. There is no rebound, no guarding, no tenderness at McBurney's point and negative Murphy's sign. No hernia.  Musculoskeletal: Normal range of motion.  Neurological: He is alert and oriented to person, place, and time. He has normal strength. No cranial nerve deficit or sensory deficit. Coordination normal. GCS eye subscore is 4. GCS verbal subscore is 5. GCS motor subscore is 6.  Skin: Skin is warm, dry and intact. No rash noted.  No cyanosis.  Psychiatric: His speech is normal and behavior is normal. He exhibits a depressed mood. He expresses suicidal ideation.    ED Course  Procedures (including critical care time) Labs Review Labs Reviewed - No data to display  Imaging Review No results found.   EKG Interpretation None      MDM   Final diagnoses:  None   intentional overdose  Suicide attempt   Depression  Patient presents to the ER for evaluation immediately after suicide attempt by overdose. Patient reports taking approximately 170 Cymbalta, 15 ibuprofen, as well as another medicine that he did not know the name of. This was a suicide attempt. The patient presented with nausea, vomiting and mild abdominal discomfort. He had a benign abdominal exam. Lab work is unremarkable. Patient is awake and alert. He does not have any significant sedation at this time.  Poison control has been consulted. They recommended admission to the hospital for observation for possible sedation secondary to the medications. Half-life is approximately 17 hours.    Gilda Creasehristopher J. Pollina, MD 04/23/14 2139

## 2014-04-23 NOTE — H&P (Signed)
[facility] History and Physical  Daniel Hodge ZOX:[id number] DOB: Jan 27, 1991 DOA: 04/23/2014  Referring physician: EDP PCP: No PCP Per Patient   Chief Complaint: OD   HPI: Daniel Flakesyler Begnaud is a 23 y.o. male who was feeling depressed this evening and reportedly attempted to comit suicide by OD.  He took 177 pills of Cymbalta, and 15 ibuprofen tablets, and another medication that is unidentified at this time.  He then vomited, and because he wasn't feeling well drove himself to the ED.  Review of Systems: Systems reviewed.  As above, otherwise negative  Past Medical History  Diagnosis Date  . Depression   . Fracture of ankle, closed 02/12/2013  . Suicide attempt   . Rib fracture   . Concussion    Past Surgical History  Procedure Laterality Date  . Tonsillectomy     Social History:  reports that he has never smoked. He has never used smokeless tobacco. He reports that he drinks alcohol. He reports that he does not use illicit drugs.  No Known Allergies  History reviewed. No pertinent family history.   Prior to Admission medications   Medication Sig Start Date End Date Taking? Authorizing Provider  docusate sodium 100 MG CAPS Take 200 mg by mouth 2 (two) times daily. 02/15/13   Freeman CaldronMichael J. Jeffery, PA-C  DULoxetine (CYMBALTA) 60 MG capsule Take 60 mg by mouth daily.    Historical Provider, MD  oxyCODONE-acetaminophen (ROXICET) 5-325 MG per tablet Take 1-2 tablets by mouth every 4 (four) hours as needed for pain. 02/15/13   Freeman CaldronMichael J. Jeffery, PA-C  polyethylene glycol [facility](MIRALAX / Ethelene HalGLYCOLAX) packet Take 17 g by mouth daily. 02/15/13   Freeman CaldronMichael J. Jeffery, PA-C   Physical Exam: Filed Vitals:   04/23/14 2055  BP: 158/96  Pulse: 73  Temp:   Resp: 14    BP 158/96  Pulse 73  Temp(Src) 97.8 F (36.6 C) (Oral)  Resp 14  SpO2 97%  General Appearance:    Alert, oriented, no distress, appears stated age  Head:    Normocephalic, atraumatic  Eyes:    PERRL, EOMI, sclera  non-icteric        Nose:   Nares without drainage or epistaxis. Mucosa, turbinates normal  Throat:   Moist mucous membranes. Oropharynx without erythema or exudate.  Neck:   Supple. No carotid bruits.  No thyromegaly.  No lymphadenopathy.   Back:     No CVA tenderness, no spinal tenderness  Lungs:     Clear to auscultation bilaterally, without wheezes, rhonchi or rales  Chest wall:    No tenderness to palpitation  Heart:    Regular rate and rhythm without murmurs, gallops, rubs  Abdomen:     Soft, non-tender, nondistended, normal bowel sounds, no organomegaly  Genitalia:    deferred  Rectal:    deferred  Extremities:   No clubbing, cyanosis or edema.  Pulses:   2+ and symmetric all extremities  Skin:   Skin color, texture, turgor normal, no rashes or lesions  Lymph nodes:   Cervical, supraclavicular, and axillary nodes normal  Neurologic:   CNII-XII intact. Normal strength, sensation and reflexes      throughout    Labs on Admission:  Basic Metabolic Panel:  Recent Labs Lab 04/23/14 2028  NA 141  K 3.6*  CL 101  CO2 23  GLUCOSE 102*  BUN 17  CREATININE 0.66  CALCIUM 10.4   Liver Function Tests:  Recent Labs Lab 04/23/14 2028  AST 17  ALT 15  ALKPHOS 105  BILITOT 0.4  PROT 8.3  ALBUMIN 4.8   No results found for this basename: LIPASE, AMYLASE,  in the last 168 hours No results found for this basename: AMMONIA,  in the last 168 hours CBC:  Recent Labs Lab 04/23/14 2028  WBC 15.0*  HGB 16.9  HCT 48.1  MCV 85.9  PLT 267   Cardiac Enzymes: No results found for this basename: CKTOTAL, CKMB, CKMBINDEX, TROPONINI,  in the last 168 hours  BNP (last 3 results) No results found for this basename: PROBNP,  in the last 8760 hours CBG: No results found for this basename: GLUCAP,  in the last 168 hours  Radiological Exams on Admission: No results found.  EKG: Independently reviewed.  Assessment/Plan Principal Problem:   Overdose Active Problems:   Suicide  attempt by substance overdose   1. OD - question wether he actually took all the medication he says he took especially since he drove himself to ED right afterwards; however, will admit for observation, tele monitor to observe QT interval. 2. SA - suicide precautions and psych evaluation prior to discharge.    Code Status: Full Code  Family Communication: No family in room Disposition Plan: Admit to obs   Time spent: 30 min  GARDNER, JARED M. [facility] Pager [phone]  If 7AM-7PM, please contact the day team taking care of the patient [email or web address] Password [email or web address] 04/23/2014, 9:50 PM

## 2014-04-23 NOTE — ED Notes (Signed)
Pt unable to void 

## 2014-04-23 NOTE — ED Notes (Signed)
Patient is alert and oriented x3.  He is being seen for Drug overdose. Patient states That he took approximately 170 Cymbalta, 15 ibuprofen with another medication that is unidentified. Patient states that he has been depressed and this is his second Suicide attempt in 5 years.   He adds that he is starting to have stomach pain 4 of 10 with nausea.

## 2014-04-24 DIAGNOSIS — X838XXS Intentional self-harm by other specified means, sequela: Secondary | ICD-10-CM

## 2014-04-24 DIAGNOSIS — T50901S Poisoning by unspecified drugs, medicaments and biological substances, accidental (unintentional), sequela: Secondary | ICD-10-CM

## 2014-04-24 DIAGNOSIS — T6591XS Toxic effect of unspecified substance, accidental (unintentional), sequela: Secondary | ICD-10-CM

## 2014-04-24 DIAGNOSIS — F332 Major depressive disorder, recurrent severe without psychotic features: Secondary | ICD-10-CM

## 2014-04-24 DIAGNOSIS — R45851 Suicidal ideations: Secondary | ICD-10-CM

## 2014-04-24 MED ORDER — ONDANSETRON HCL 4 MG/2ML IJ SOLN
4.0000 mg | Freq: Four times a day (QID) | INTRAMUSCULAR | Status: DC | PRN
  Administered 2014-04-24: 4 mg via INTRAVENOUS
  Filled 2014-04-24: qty 2

## 2014-04-24 MED ORDER — ENOXAPARIN SODIUM 60 MG/0.6ML ~~LOC~~ SOLN
60.0000 mg | SUBCUTANEOUS | Status: DC
  Administered 2014-04-24: 60 mg via SUBCUTANEOUS
  Filled 2014-04-24 (×2): qty 0.6

## 2014-04-24 MED ORDER — HYDRALAZINE HCL 20 MG/ML IJ SOLN
5.0000 mg | Freq: Four times a day (QID) | INTRAMUSCULAR | Status: DC | PRN
  Administered 2014-04-24: 5 mg via INTRAVENOUS
  Filled 2014-04-24 (×2): qty 0.25

## 2014-04-24 NOTE — Progress Notes (Signed)
Patient ID: Daniel Hodge, male   DOB: 03-Jun-1991, 23 y.o.   MRN: [id number] [facility] PROGRESS NOTE  Daniel Hodge [id number] DOB: 03-Jun-1991 DOA: 04/23/2014 PCP: No PCP Per Patient  Brief narrative: 23 y.o. Male with past medical history of depression and suicidal attempts in 2011 requiring admission to behavioral health [facility] with attempt to commit suicide by overdose on psych meds (cymbalta)  Assessment/Plan:  Principal Problem: Suicide attempt by substance overdose - Psychiatry consulted - Feels better this am - Sitter at bedside  - Suicide precaution   Active Problems: Hypokalemia - repeat BMP in am Hypertension - may use hydralazine PRN - likely elevated due to drug overdose   DVT prophylaxis: Lovenox subQ   Code Status: full code  Family Communication: plan of care discussed with the patient Disposition Plan: home when stable   Manson PasseyEVINE, Zavier Canela, MD  [facility] Pager [phone]  If 7PM-7AM, please contact night-coverage [email or web address] Password [email or web address] 04/24/2014, 10:59 AM   LOS: 1 day   Consultants:  Psychiatry   Procedures:  None   Antibiotics:  None   HPI/Subjective: No acute overnight events.  Objective: Filed Vitals:   04/23/14 2013 04/23/14 2055 04/24/14 0017 04/24/14 0440  BP: 162/94 158/96 147/90 142/92  Pulse:  73 83 80  Temp: 97.8 F (36.6 C)  97.7 F (36.5 C) 98 F (36.7 C)  TempSrc: Oral  Oral Oral  Resp: 18 14 18 18   Height:   6\' 5"  (1.956 m)   Weight:   123.8 kg (272 lb 14.9 oz)   SpO2: 95% 97% 99% 99%    Intake/Output Summary (Last 24 hours) at 04/24/14 1059 Last data filed at 04/24/14 0745  Gross per 24 hour  Intake   1675 ml  Output    200 ml  Net   1475 ml    Exam:   General:  Pt is alert, follows commands appropriately, not in acute distress  Cardiovascular: Regular rate and rhythm, S1/S2, no murmurs  Respiratory: Clear to auscultation bilaterally, no wheezing, no  crackles, no rhonchi  Abdomen: Soft, non tender, non distended, bowel sounds present  Extremities: No edema, pulses DP and PT palpable bilaterally  Neuro: Grossly nonfocal  Data Reviewed: Basic Metabolic Panel:  Recent Labs Lab 04/23/14 2028  NA 141  K 3.6*  CL 101  CO2 23  GLUCOSE 102*  BUN 17  CREATININE 0.66  CALCIUM 10.4   Liver Function Tests:  Recent Labs Lab 04/23/14 2028  AST 17  ALT 15  ALKPHOS 105  BILITOT 0.4  PROT 8.3  ALBUMIN 4.8   No results found for this basename: LIPASE, AMYLASE,  in the last 168 hours No results found for this basename: AMMONIA,  in the last 168 hours CBC:  Recent Labs Lab 04/23/14 2028  WBC 15.0*  HGB 16.9  HCT 48.1  MCV 85.9  PLT 267   Cardiac Enzymes: No results found for this basename: CKTOTAL, CKMB, CKMBINDEX, TROPONINI,  in the last 168 hours BNP: No components found with this basename: POCBNP,  CBG: No results found for this basename: GLUCAP,  in the last 168 hours  No results found for this or any previous visit (from the past 240 hour(s)).   Studies: No results found.  Scheduled Meds: . sodium chloride  3 mL Intravenous Q12H   Continuous Infusions:

## 2014-04-24 NOTE — Progress Notes (Signed)
The sitter is at the bedside. RN will continue to monitor the patient.

## 2014-04-24 NOTE — Progress Notes (Signed)
Clinical Social Work Department CLINICAL SOCIAL WORK PSYCHIATRY SERVICE LINE ASSESSMENT 04/24/2014  Patient:  Daniel Hodge  Account:  [id number]  Admit Date:  04/23/2014  Clinical Social Worker:  Sindy Messing, LCSW  Date/Time:  04/24/2014 12:00 N Referred by:  Physician  Date referred:  04/24/2014 Reason for Referral  Psychosocial assessment   Presenting Symptoms/Problems (In the person's/family's own words):   Psych consulted due to overdose.   Abuse/Neglect/Trauma History (check all that apply)  Denies history   Abuse/Neglect/Trauma Comments:   Psychiatric History (check all that apply)  Outpatient treatment  Inpatient/hospitilization   Psychiatric medications:  Cymbalta 60 mg   Current Mental Health Hospitalizations/Previous Mental Health History:   Patient reports he was diagnosed with depression and has been on medication for the past 4 years. Patient reports that he was diagnosed after a suicide attempt in 2011. Patient's PCP prescribes medication and patient recently stopped attending therapy appointments.   Current provider:   PCP   Place and Date:   [place], [place]   Current Medications:   Scheduled Meds:      . enoxaparin (LOVENOX) injection  60 mg Subcutaneous Q24H  . sodium chloride  3 mL Intravenous Q12H        Continuous Infusions:      PRN Meds:.hydrALAZINE, ondansetron (ZOFRAN) IV       Previous Impatient Admission/Date/Reason:   Patient was at [facility] in 2011.   Emotional Health / Current Symptoms    Suicide/Self Harm  Suicide attempt in past (date/description)   Suicide attempt in the past:   Patient was admitted after overdosing. Patient reports this is second attempt and previously tried to overdose in 2011. Patient denies any current SI or HI.   Other harmful behavior:   None reported   Psychotic/Dissociative Symptoms  None reported   Other Psychotic/Dissociative Symptoms:    Attention/Behavioral Symptoms  Within Normal Limits   Other  Attention / Behavioral Symptoms:   Patient engaged during assessment.    Cognitive Impairment  Within Normal Limits   Other Cognitive Impairment:   Patient alert and oriented.    Mood and Adjustment  Mood Congruent    Stress, Anxiety, Trauma, Any Recent Loss/Stressor  Relationship   Anxiety (frequency):   N/A   Phobia (specify):   N/A   Compulsive behavior (specify):   N/A   Obsessive behavior (specify):   N/A   Other:   Patient reports a recent break up with girlfriend. Patient reports that his friends have started to not return his calls and he feels lonely.   Substance Abuse/Use  Current substance use   SBIRT completed (please refer for detailed history):  Y  Self-reported substance use:   Patient reports he drinks about 2 weekends out of the month. Patient reports he does not binge drink or drink to get drunk. Patient denies all other substance use. Patient denies any emotional connection to alcohol but drinks socially.   Urinary Drug Screen Completed:  Y Alcohol level:   <11    Environmental/Housing/Living Arrangement  Stable housing   Who is in the home:   Roommate   Emergency contact:  Martin   Patient's Strengths and Goals (patient's own words):   Patient reports supportive family. Patient is currently employed and has stable housing.   Clinical Social Worker's Interpretive Summary:   CSW received referral in order to complete psychosocial assessment. CSW reviewed chart and met with patient at bedside with psych MD. CSW introduced myself and  explained role.    Patient reports he was at home and spoke with ex-girlfriend over the phone. Patient reports they were not arguing but ex-girlfriend was talking about how well she was doing in [place]. Patient reports they broke up because she was moving to [place] and he did not want to be in a long distance relationship. Patient states that he was somewhat hurt by the  fact that she is doing so well and excelling but he is not satisfied with his life. Patient reports that his friends have also become disconnected and he was just not happy and felt he did not want to live any longer. Patient reports about 30 minutes after getting off the phone with ex-girlfriend he decided to overdose on medications. Patient reports he started feeling ill about 45 minutes after overdose and decided to come to the hospital. Patient called his mom on the way to the hospital and vomited in the parking lot.    Patient denies any previous trauma or abuse. Patient reports he was having similar friendship problems in high school when depression began. Patient reports that after previous overdose in 2011 that he went to [facility] for treatment but did not find it to be helpful. Patient reports that he was seeing a psychiatrist and therapist after hospitalization but his PCP has been prescribing medications recently. Patient reports that he stopped seeing therapist a few weeks ago because she was late for appointments and he did not feel that he mattered to her as a client.    Patient reports that he is aware he needs further treatment and better coping skills. Patient is an only child but reports good relationship with parents. Patient's parents divorced awhile ago but patient reports he likes his step-parents as well.    Psych MD is recommending inpatient psych placement at [place]. Patient and mom aware and agreeable to recommendations. CSW will continue to follow and will assist with placement once patient is medically stable.   Disposition:  Inpatient referral made [facility], [facility], [place])   [place], [place] [phone]

## 2014-04-24 NOTE — Progress Notes (Signed)
Security "wanded" the patient and the patient's mother. The mother took her hand bag and belongings to her car. The mother now has the car keys in the psych pt. lock-up.

## 2014-04-24 NOTE — Progress Notes (Signed)
Utilization Review Completed.Emmylou Bieker T7/04/2014  

## 2014-04-24 NOTE — Progress Notes (Signed)
Ref[facility] the [facility] The following facilities were at capacity: [facility]  [facility] Dis[facility] Tech

## 2014-04-24 NOTE — Progress Notes (Signed)
ANTICOAGULATION CONSULT NOTE - Initial Consult  Pharmacy Consult for Lovenox Indication: VTE prophylaxis  No Known Allergies  Patient Measurements: Height: 6\' 5"  (195.6 cm) Weight: 272 lb 14.9 oz (123.8 kg) IBW/kg (Calculated) : 89.1  Vital Signs: Temp: 98 F (36.7 C) (07/07 0440) Temp src: Oral (07/07 0440) BP: 142/92 mmHg (07/07 0440) Pulse Rate: 80 (07/07 0440)  Labs:  Recent Labs  04/23/14 2028  HGB 16.9  HCT 48.1  PLT 267  CREATININE 0.66    Estimated Creatinine Clearance: 209.2 ml/min (by C-G formula based on Cr of 0.66).   Medical History: Past Medical History  Diagnosis Date  . Depression   . Fracture of ankle, closed 02/12/2013  . Suicide attempt   . Rib fracture   . Concussion      Assessment: [age] yoM admitted 7/7 with suicide attempt by overdose on Cymbalta.  PMH includes depression and suicide attempt in 2011.  Pharmacy is consulted to dose Lovenox for VTE prophylaxis.  SCr 0.66, CrCl > 100 ml/min  CBC: Hgb 16.9, Plt 267  Weight ~ 124 kg with BMI 32  Goal of Therapy:  VTE prophylaxis Monitor platelets by anticoagulation protocol: Yes   Plan:   Lovenox 0.5 mg/kg,  60mg , SQ once daily  Pharmacy to sign off.  Lynann Beaverhristine Camillo Quadros PharmD, BCPS Pager [phone] 04/24/2014 11:38 AM

## 2014-04-24 NOTE — Progress Notes (Signed)
Patient addressed concerns to RN and mother about inpatient rehab, and said "this was not helpful in the past". He would prefer outpatient options. RN expressed to mother that this may or may not be an option based on the current "crisis situation" and it would be up to the psych MD to make final recommendations once he is medically cleared. Mother expressed understanding and thanked Charity fundraiserN and staff for the care they were providing son. J.Sharene Krikorian, RN
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
## 2014-04-25 DIAGNOSIS — J9600 Acute respiratory failure, unspecified whether with hypoxia or hypercapnia: Secondary | ICD-10-CM

## 2014-04-25 DIAGNOSIS — S8290XS Unspecified fracture of unspecified lower leg, sequela: Secondary | ICD-10-CM

## 2014-04-25 DIAGNOSIS — S279XXS Injury of unspecified intrathoracic organ, sequela: Secondary | ICD-10-CM

## 2014-04-25 LAB — BASIC METABOLIC PANEL
Anion gap: 10 (ref 5–15)
BUN: 13 mg/dL (ref 6–23)
CHLORIDE: 101 meq/L (ref 96–112)
CO2: 26 mEq/L (ref 19–32)
Calcium: 9.4 mg/dL (ref 8.4–10.5)
Creatinine, Ser: 0.65 mg/dL (ref 0.50–1.35)
GFR calc Af Amer: >90 mL/min (ref >90)
GLUCOSE: 85 mg/dL (ref 70–99)
POTASSIUM: 3.9 meq/L (ref 3.7–5.3)
Sodium: 137 mEq/L (ref 137–147)

## 2014-04-25 NOTE — Progress Notes (Signed)
Belongings removed from locked cabinet on floor and given to patient, patient and family looked over belongings and confirmed that everything they had come in with was present in belongings bag. Patient left with [facility] to be transported to [facility]. Report called to facility at around 1 PM.
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
## 2014-04-25 NOTE — Discharge Instructions (Signed)
Overdose, Adult  A person can overdose on alcohol, drugs or both by accident or on purpose. If it was on purpose, it is a serious matter. Professional help should be sought. If the overdose was an accident, certain steps should be taken to make sure that it never happens again.  ACCIDENTAL OVERDOSE  Overdosing on prescription medications can be a result of:   Not understanding the instructions.   Misreading the label.   Forgetting that you took a dose and then taking another by mistake. This situation happens a lot.  To make sure this does not happen again:   Clarify the correct dosage with your caregiver.   Place the correct dosage in a "pill-minder" container (labeled for each day and time of day).   Have someone dispense your medicine.  Please be sure to follow-up with your primary care doctor as directed.  INTENTIONAL OVERDOSE  If the overdose was on purpose, it is a serious situation. Taking more than the prescribed amount of medications (including taking someone else's prescription), abusing street drugs or drinking an amount of alcohol that requires medical treatment can show a variety of possible problems. These may indicate you:   Are depressed or suicidal.   Are abusing drugs, took too much or combined different drugs to experiment with the effects.   Mixed alcohol with drugs and did not realize the danger of doing so (this is drug abuse).   Are suffering addiction to drugs and/or alcohol (also known as chemical dependency).   Binge drink.  If you have not been referred to a mental health professional for help, it is important that you get help right away. Only a professional can determine which problems may exist and what the best course of treatment may be. It is your responsibility to follow-up with further evaluation or treatment as directed.   Alcohol is responsible for a large number of overdoses and unintended deaths among college-age young adults. Binge drinking is consuming 4-5 drinks  in a short period of time. The amount of alcohol in standard servings of wine (5 oz.), beer (12 oz.) and distilled spirits (1.5 oz., 80 proof) is the same. Beer or wine can be just as dangerous to the binge drinker as "hard" liquor can be.   CONSEQUENCES OF BINGE DRINKING  Alcohol poisoning is the most serious consequence of binge drinking. This is a severe and potentially fatal physical reaction to an alcohol overdose. When too much alcohol is consumed, the brain does not get enough oxygen. The lack of oxygen will eventually cause the brain to shut down the voluntary functions that regulate breathing and heart rate. Symptoms of alcohol poisoning include:   Vomiting.   Passing out (unconsciousness).   Cold, clammy, pale or bluish skin.   Slow or irregular breathing.  WHAT SHOULD I DO NEXT?  If you have a history of drug abuse or suffer chemical dependency (alcoholism, drug addiction or both), you might consider the following:   Talk with a qualified substance abuse counselor and consider entering a treatment program.   Go to a detox facility if necessary.   If you were attending self-help group meetings, consider returning to them and go often.   Explore other resources located near you (see sources listed below).  If you are unsure if you have a substance abuse problem, ask yourself the following questions:   Have you been told by friends or family that drugs/alcohol has become a problem?   Do you get into fights   when drinking or using drugs?   Do you have blackouts (not remembering what you do while using)?   Do you lie about use or amounts of drugs or alcohol you consume?   Do you need chemicals to get you going?   Do you suffer in work or school performance because of drug or alcohol use?   Do you get sick from drug or alcohol use but continue to use anyway?   Do you need drugs or alcohol to relate to people or feel comfortable in social situations?   Do you use drugs or alcohol to forget  problems?  If you answered "Yes" to any of the above questions, it means you show signs of chemical dependency and a professional evaluation is suggested. The longer the use of drugs and alcohol continues, the problems will become greater.  SEEK IMMEDIATE MEDICAL CARE IF:    You feel like you might repeat your problematic behavior.   You need someone to talk to and feel that it should not wait.   You feel you are a danger to yourself or someone else.   You feel like you are having a new reaction to medications you are taking, or you are getting worse after leaving a care center.   You have an overwhelming urge to drink or use drugs.  Addiction cannot be cured, but it can be treated successfully. Treatment centers are listed in the yellow pages under: Cocaine, Narcotics, and Alcoholics Anonymous. Most hospitals and clinics can refer you to a specialized care center. The [place] government maintains a toll-free number for obtaining treatment referrals: 1-800-662-4357 or 1-800-487-4889 (TDD) and maintains a website: http://findtreatment.samhsa.gov. Other websites for additional information are: www.mentalhealth.samhsa.gov. and www.nida.gov.  In [place] treatment resources are listed in each Province with listings available under [employer] or similar titles.  Document Released: 10/08/2003 Document Revised: 12/28/2011 Document Reviewed: 08/29/2008  Exit[employer] Patient Information 2015 [employer]. This information is not intended to replace advice given to you by your health care provider. Make sure you discuss any questions you have with your health care provider.

## 2014-04-25 NOTE — Discharge Summary (Signed)
Physician Discharge Summary  Daniel Hodge ZOX:[id number] DOB: [birth year] DOA: 04/23/2014  PCP: No PCP Per Patient  Admit date: 04/23/2014 Discharge date: 04/25/2014  Recommendations for Outpatient Follow-up:  1. [facility]  Discharge Diagnoses:  Principal Problem:   Overdose Active Problems:   Suicide attempt by substance overdose    Discharge Condition: stable   Diet recommendation: as tolerated   History of present illness:  23 y.o. male with past medical history of depression and suicidal attempts in 2011 requiring admission to behavioral health who [facility] with attempt to commit suicide by overdose on psych meds (cymbalta).  Assessment/Plan:   Principal Problem:  Suicide attempt by substance overdose  - Psychiatry consulted; [facility]  - Sitter at bedside   Active Problems:  Hypokalemia  - repleted and now resolved to WNL Hypertension  - likely elevated due to drug overdose  - only used PRN hydralazine; BP good this am 132/72   DVT prophylaxis: Lovenox subQ while pt is in hospital   Code Status: full code  Family Communication: plan of care discussed with the patient    Consultants:  Psychiatry  Procedures:  None  Antibiotics:  None   Signed:  Manson PasseyEVINE, Adiel Erney, MD  [facility] 04/25/2014, 9:28 AM  Pager #: [phone]   Discharge Exam: Filed Vitals:   04/25/14 0431  BP: 132/72  Pulse: 65  Temp: 97.5 F (36.4 C)  Resp: 20   Filed Vitals:   04/24/14 1329 04/24/14 2004 04/24/14 2254 04/25/14 0431  BP:  144/94 144/88 132/72  Pulse:  78  65  Temp:  97.5 F (36.4 C)  97.5 F (36.4 C)  TempSrc:  Oral  Oral  Resp:  20  20  Height:      Weight:      SpO2: 100% 100%  100%    General: Pt is alert, follows commands appropriately, not in acute distress Cardiovascular: Regular rate and rhythm, S1/S2 +, no murmurs Respiratory: Clear to auscultation bilaterally, no wheezing, no  crackles, no rhonchi Abdominal: Soft, non tender, non distended, bowel sounds +, no guarding Extremities: no edema, no cyanosis, pulses palpable bilaterally DP and PT Neuro: Grossly nonfocal  Discharge Instructions  Discharge Instructions   Call MD for:  difficulty breathing, headache or visual disturbances    Complete by:  As directed      Call MD for:  persistant dizziness or light-headedness    Complete by:  As directed      Call MD for:  persistant nausea and vomiting    Complete by:  As directed      Call MD for:  severe uncontrolled pain    Complete by:  As directed      Diet - low sodium heart healthy    Complete by:  As directed      Increase activity slowly    Complete by:  As directed             Medication List    STOP taking these medications       doxycycline 100 MG capsule  Commonly known as:  VIBRAMYCIN     ibuprofen 800 MG tablet  Commonly known as:  ADVIL,MOTRIN      TAKE these medications       DULoxetine 60 MG capsule  Commonly known as:  CYMBALTA  Take 60 mg by mouth daily.     FLEXERIL 5 MG tablet  Generic drug:  cyclobenzaprine  Take 5 mg by mouth 3 (three) times daily as needed for muscle spasms.          The results of significant diagnostics from this hospitalization (including imaging, microbiology, ancillary and laboratory) are listed below for reference.    Significant Diagnostic Studies: No results found.  Microbiology: No results found for this or any previous visit (from the past 240 hour(s)).   Labs: Basic Metabolic Panel:  Recent Labs Lab 04/23/14 2028 04/25/14 0420  NA 141 137  K 3.6* 3.9  CL 101 101  CO2 23 26  GLUCOSE 102* 85  BUN 17 13  CREATININE 0.66 0.65  CALCIUM 10.4 9.4   Liver Function Tests:  Recent Labs Lab 04/23/14 2028  AST 17  ALT 15  ALKPHOS 105  BILITOT 0.4  PROT 8.3  ALBUMIN 4.8   No results found for this basename: LIPASE, AMYLASE,  in the last 168 hours No results found for this  basename: AMMONIA,  in the last 168 hours CBC:  Recent Labs Lab 04/23/14 2028  WBC 15.0*  HGB 16.9  HCT 48.1  MCV 85.9  PLT 267   Cardiac Enzymes: No results found for this basename: CKTOTAL, CKMB, CKMBINDEX, TROPONINI,  in the last 168 hours BNP: BNP (last 3 results) No results found for this basename: PROBNP,  in the last 8760 hours CBG: No results found for this basename: GLUCAP,  in the last 168 hours  Time coordinating discharge: Over 30 minutes

## 2014-04-25 NOTE — Progress Notes (Signed)
Clinical Social Work  Per chart review, patient is medically stable to DC today. CSW contacted the following facilities re: placement:  [facility]- available beds. Referral sent.  [facility] ([facility]) is unsure of bed status but will call CSW at 2pm if beds available  [facility]  [facility]. Referral sent.  [facility]- [facility] beds. Referral sent.  CSW will continue to follow.  [place] Davionte [place], [place] [phone]

## 2014-04-25 NOTE — Progress Notes (Signed)
Clinical Social Work  Patient accepted by [facility] Wend[facility]ohl [facility] RN to call report to [phone]. CSW informed patient of DC plans. Patient reports he is aware of treatment needs and agreeable to sign [facility]. Patient agreeable to family involvement so father and step mother joined CSW and patient in room. CSW answered questions and provided directions and information to [facility]. CSW called patient's mother as well who will bring patient's clothes. CSW coordinated transportation via [employer] for 1:30pm. Patient, family, MD, psych MD, and RN all aware of DC plans.  [place], [place] [phone]
